# Patient Record
Sex: Male | Born: 1995 | Race: Black or African American | Hispanic: No | Marital: Single | Smoking: Current some day smoker
Health system: Southern US, Community
[De-identification: ages and names within clinical notes are randomized; demographics above are authoritative.]

## PROBLEM LIST (undated history)

## (undated) DIAGNOSIS — E739 Lactose intolerance, unspecified: Secondary | ICD-10-CM

## (undated) DIAGNOSIS — Z9889 Other specified postprocedural states: Secondary | ICD-10-CM

## (undated) DIAGNOSIS — I456 Pre-excitation syndrome: Secondary | ICD-10-CM

## (undated) HISTORY — PX: CARDIAC ELECTROPHYSIOLOGY MAPPING AND ABLATION: SHX1292

---
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
## 2015-06-29 ENCOUNTER — Emergency Department (HOSPITAL_BASED_OUTPATIENT_CLINIC_OR_DEPARTMENT_OTHER)
Admission: EM | Admit: 2015-06-29 | Discharge: 2015-06-29 | Disposition: A | Discharge status: Home / Self Care | Payer: BLUE CROSS/BLUE SHIELD | Attending: Emergency Medicine | Admitting: Emergency Medicine

## 2015-06-29 ENCOUNTER — Encounter (HOSPITAL_BASED_OUTPATIENT_CLINIC_OR_DEPARTMENT_OTHER): Payer: Self-pay | Admitting: Emergency Medicine

## 2015-06-29 DIAGNOSIS — H9201 Otalgia, right ear: Secondary | ICD-10-CM | POA: Diagnosis present

## 2015-06-29 HISTORY — DX: Lactose intolerance, unspecified: E73.9

## 2015-06-29 NOTE — ED Notes (Signed)
Pt having right ear pain for over one week after going to [employer].  Denies fever or drainage.

## 2015-06-29 NOTE — Discharge Instructions (Signed)
Your ear canal and ear tympanic membrane looks normal. There are no signs of drainage, skin sloughing, or infection.   You likely have irritation of the cartilage of your ear from earbuds at this time. Take ibuprofen and tylenol for pain. Ice as needed.  Return for worsening symptoms, including escalating pain, significant redness or swelling around the ear, hearing loss, or any other symptoms concerning to you.

## 2015-06-29 NOTE — ED Provider Notes (Signed)
CSN: [id number]     Arrival date & time 06/29/15  4098 History   First MD Initiated Contact with Patient 06/29/15 1000     Chief Complaint  Patient presents with  . Otalgia     (Consider location/radiation/quality/duration/timing/severity/associated sxs/prior Treatment) HPI 20 year old male with history of right ear pain. History of WPW with ablation. States 3 days ago while placing earbuds in his ear, he noticed a discomfort over the tragus of the right ear. Pain has been constant and gradually worsening, aggravating by moving his tragus and palpation. Denies fever, chills, swelling or redness, hearing loss. No recent cough, congestion, sore throat, or runny nose.  Past Medical History  Diagnosis Date  . WPW (Wolff-Parkinson-White syndrome)   . H/O prior ablation treatment   . Lactose intolerance    Past Surgical History  Procedure Laterality Date  . Cardiac electrophysiology mapping and ablation     History reviewed. No pertinent family history. Social History  Substance Use Topics  . Smoking status: Never Smoker   . Smokeless tobacco: Never Used  . Alcohol Use: No    Review of Systems 10/14 systems reviewed and are negative other than those stated in the HPI    Allergies  Review of patient's allergies indicates no known allergies.  Home Medications   Prior to Admission medications   Medication Sig Start Date End Date Taking? Authorizing Provider  acetaminophen (TYLENOL) 500 MG tablet Take 1 tablet (500 mg total) by mouth every 6 (six) hours as needed for pain. 11/08/11   Kaitlyn Szekalski, PA-C  doxycycline (VIBRAMYCIN) 100 MG capsule Take 1 capsule (100 mg total) by mouth 2 (two) times daily. One po bid x 7 days 07/21/13   Linwood Dibbles, MD  ibuprofen (ADVIL,MOTRIN) 800 MG tablet Take 1 tablet (800 mg total) by mouth 3 (three) times daily. 07/21/13   Linwood Dibbles, MD   BP 121/68 mmHg  Pulse 76  Temp(Src) 98.6 F (37 C) (Oral)  Resp 18  Ht  (1.753 m)  Wt 170 lb  (77.111 kg)  BMI 25.09 kg/m2  SpO2 100% Physical Exam Physical Exam  Nursing note and vitals reviewed. Constitutional: Well developed, well nourished, non-toxic, and in no acute distress Head: Normocephalic and atraumatic.  Eyes: no discharge Nose: normal Ears: Bilateral TMs normal. No drainage or skin changes of the external canals. Tenderness to palpation of the right tragus without swelling or erythema. Mouth/Throat: Oropharynx is clear and moist.  Neck: Normal range of motion.  Cardiovascular: Normal rate and regular rhythm.   Pulmonary/Chest: Effort normal.  Abdominal: Soft.  Musculoskeletal: No deformities. Neurological: Alert, no facial droop, fluent speech, moves all extremities symmetrically Skin: Skin is warm and dry.  Psychiatric: Cooperative  ED Course  Procedures (including critical care time) Labs Review Labs Reviewed - No data to display  Imaging Review No results found. I have personally reviewed and evaluated these images and lab results as part of my medical decision-making.   EKG Interpretation None      MDM   Final diagnoses:  Right ear pain     With right ear pain over the past 3 days. Pain localized to the right tragus, and tender to palpation. Normal TMs bilaterally in no acute issues with the external ear canals. Suspect potential pain from constant pressure of his earbuds, which he reports wearing a lot throughout the day. No signs of infection or any other serious process at this time. Discussed supportive care including Motrin and Tylenol, icing as  needed. He will avoid wearing his headphones during this time. Strict return and follow-up instructions reviewed. He expressed understanding of all discharge instructions and felt comfortable with the plan of care.    Lavera Guiseana Duo Liu, MD 06/29/15 1032

## 2015-07-14 ENCOUNTER — Emergency Department (HOSPITAL_BASED_OUTPATIENT_CLINIC_OR_DEPARTMENT_OTHER): Payer: BLUE CROSS/BLUE SHIELD

## 2015-07-14 ENCOUNTER — Encounter (HOSPITAL_BASED_OUTPATIENT_CLINIC_OR_DEPARTMENT_OTHER): Payer: Self-pay | Admitting: Emergency Medicine

## 2015-07-14 ENCOUNTER — Emergency Department (HOSPITAL_BASED_OUTPATIENT_CLINIC_OR_DEPARTMENT_OTHER)
Admission: EM | Admit: 2015-07-14 | Discharge: 2015-07-14 | Disposition: A | Discharge status: Home / Self Care | Payer: BLUE CROSS/BLUE SHIELD | Attending: Emergency Medicine | Admitting: Emergency Medicine

## 2015-07-14 DIAGNOSIS — N50811 Right testicular pain: Secondary | ICD-10-CM

## 2015-07-14 DIAGNOSIS — F17200 Nicotine dependence, unspecified, uncomplicated: Secondary | ICD-10-CM | POA: Diagnosis not present

## 2015-07-14 DIAGNOSIS — R52 Pain, unspecified: Secondary | ICD-10-CM

## 2015-07-14 MED ORDER — IBUPROFEN 800 MG PO TABS
800.0000 mg | ORAL_TABLET | Freq: Once | ORAL | Status: AC
  Administered 2015-07-14: 800 mg via ORAL
  Filled 2015-07-14: qty 1

## 2015-07-14 NOTE — ED Notes (Signed)
Patient reports this morning around 0100 he began having pain in the right testicle while laying in bed.  States that he then twisted it around to make it feel better.  States this helped but states that now it still feels better but doesn't feel "right".

## 2015-07-14 NOTE — Discharge Instructions (Signed)
If the pain comes back as it was when you went to bed, you need to be seen by a doctor. Do not hesitate to come back.  Call Dr. Mina MarbleMacDiarmid's office (urologist) to schedule a follow up appointment  Testicular Torsion Testicular torsion is a twisting of the spermatic cord, artery, and vein that go to the testicle. This twisting cuts off the blood supply to everything in the sac that contains the testes, blood vessels, and part of the spermatic cord (scrotum). Testicular torsion is most commonly seen in newborn and adolescent males. It can also occur before birth. Testicular torsion requires emergency treatment. The testicle usually can be saved if the torsion is treated within 6 hours of onset. If the torsion is left untreated for too long, the testicle will die and have to be removed.  CAUSES  Torsion can be caused by a hit on the scrotum or by certain movements during exercise. In some males, testicular torsion is more common because the connection of their testicle to a specific tissue in their scrotum developed in the wrong place, allowing the testicle to rotate and the cord to get twisted.  SIGNS AND SYMPTOMS  The main symptom of testicular torsion is pain in your testicle. The scrotum may be swollen, red, hard, and very tender. There will be excess fluid in the tissue (edema).The testicle may be higher than normal in the scrotum. The skin of the scrotum may be stuck to the testicle. You may have nausea, vomiting, and a fever. DIAGNOSIS  Often testicular torsion is diagnosed through a physical exam. Sometimes imaging exams and tests to measure blood flow may be done. TREATMENT  A manual untwisting of the testicle may be done when the testicle is still mobile and the maneuver is not too painful. However, surgery usually is necessary and should be done as soon as possible after torsion occurs. During surgery, the testicle is untwisted and evaluated and possibly removed.    This information is not  intended to replace advice given to you by your health care provider. Make sure you discuss any questions you have with your health care provider.   Document Released: 01/09/2005 Document Revised: 01/14/2013 Document Reviewed: 07/01/2012 Elsevier Interactive Patient Education [employer]

## 2015-07-14 NOTE — ED Notes (Signed)
Patient transported to ultrasound.

## 2015-07-14 NOTE — ED Notes (Signed)
Resident at the bedside

## 2015-07-14 NOTE — ED Provider Notes (Signed)
CSN: [id number]     Arrival date & time 07/14/15  0704 History   First MD Initiated Contact with Patient 07/14/15 [phone]     Chief Complaint  Patient presents with  . Testicle Pain   HPI Gabriel Leon is a 20 y.o. male  presenting with right testicular pain. He states pain started lasted night as he was trying to go to sleep. It did not wake him up. The pain became very severe, and he says that he "untwisted" the right testicle 1.5 turns with relief. He was able to get to sleep but still having that feeling when he woke up this morning so he decided to come in and be evaluated. He currently describes the feeling as "uncomfortable" and "weird", but says he is not sure if he is just convincing himself of this/in his head; not really painful right now. There is no pain above the testicle or in the groin. The feeling does not radiate anywhere. He says nothing makes it better or worse currently. He denies recent sexual activity or new partners, no discharge or dysuria. Denies fevers/chills, nausea or vomiting, diarrhea or constipation, abdominal pain. He denies history of undescended testicle or testicular cancer in the family.   (Consider location/radiation/quality/duration/timing/severity/associated sxs/prior Treatment) Patient is a 20 y.o. male presenting with testicular pain. The history is provided by the patient.  Testicle Pain This is a new problem. The current episode started today. The problem occurs intermittently. The problem has been rapidly improving. Pertinent negatives include no abdominal pain, anorexia, arthralgias, change in bowel habit, chills, coughing, fever, joint swelling, nausea, rash, sore throat, urinary symptoms, vomiting or weakness. Nothing aggravates the symptoms. He has tried position changes for the symptoms. The treatment provided significant relief.    Past Medical History  Diagnosis Date  . WPW (Wolff-Parkinson-White syndrome)   . H/O prior ablation treatment   .  Lactose intolerance    Past Surgical History  Procedure Laterality Date  . Cardiac electrophysiology mapping and ablation     History reviewed. No pertinent family history. Social History  Substance Use Topics  . Smoking status: Current Some Day Smoker  . Smokeless tobacco: Never Used  . Alcohol Use: Yes     Comment: occassional    Review of Systems  Constitutional: Negative for fever and chills.  HENT: Negative for sore throat and trouble swallowing.   Respiratory: Negative for cough and shortness of breath.   Gastrointestinal: Negative for nausea, vomiting, abdominal pain, anorexia and change in bowel habit.  Genitourinary: Positive for scrotal swelling and testicular pain. Negative for dysuria, urgency, frequency, flank pain, decreased urine volume, penile swelling and penile pain.  Musculoskeletal: Negative for joint swelling and arthralgias.  Skin: Negative for rash.  Neurological: Positive for dizziness (brief). Negative for weakness.  All other systems reviewed and are negative.     Allergies  Review of patient's allergies indicates no known allergies.  Home Medications   Prior to Admission medications   Medication Sig Start Date End Date Taking? Authorizing Provider  acetaminophen (TYLENOL) 500 MG tablet Take 1 tablet (500 mg total) by mouth every 6 (six) hours as needed for pain. 11/08/11   Kaitlyn Szekalski, PA-C  doxycycline (VIBRAMYCIN) 100 MG capsule Take 1 capsule (100 mg total) by mouth 2 (two) times daily. One po bid x 7 days 07/21/13   Linwood Dibbles, MD  ibuprofen (ADVIL,MOTRIN) 800 MG tablet Take 1 tablet (800 mg total) by mouth 3 (three) times daily. 07/21/13   Linwood Dibbles,  MD   BP 134/86 mmHg  Pulse 76  Temp(Src) 98.9 F (37.2 C) (Oral)  Resp 18  Ht 5\' 9"  (1.753 m)  Wt 77.111 kg  BMI 25.09 kg/m2  SpO2 100% Physical Exam  Constitutional: He is oriented to person, place, and time. He appears well-developed and well-nourished.  HENT:  Head: Normocephalic  and atraumatic.  Eyes: Conjunctivae and EOM are normal. Pupils are equal, round, and reactive to light.  Cardiovascular: Normal rate, regular rhythm, normal heart sounds and intact distal pulses.   Pulmonary/Chest: Effort normal and breath sounds normal. No respiratory distress.  Abdominal: Hernia confirmed negative in the right inguinal area and confirmed negative in the left inguinal area.  Genitourinary: Penis normal. Right testis shows no mass, no swelling and no tenderness (no tenderness of testicle or epididymis). Right testis is descended. Cremasteric reflex is not absent on the right side. Left testis shows no mass, no swelling and no tenderness. Left testis is descended. Cremasteric reflex is not absent on the left side. Circumcised. No discharge found.  Lymphadenopathy:       Right: No inguinal adenopathy present.       Left: No inguinal adenopathy present.  Neurological: He is alert and oriented to person, place, and time.  Skin: Skin is warm and dry. No rash noted.  Nursing note and vitals reviewed.   ED Course  Procedures (including critical care time) Labs Review Labs Reviewed - No data to display  Imaging Review [place] Scrotum  07/14/2015  CLINICAL DATA:  RIGHT testicular pain since 0100 hr, pain has decreased, no injury, history smoking EXAM: SCROTAL ULTRASOUND DOPPLER ULTRASOUND OF THE TESTICLES TECHNIQUE: Complete ultrasound examination of the testicles, epididymis, and other scrotal structures was performed. Color and spectral Doppler ultrasound were also utilized to evaluate blood flow to the testicles. COMPARISON:  None FINDINGS: Right testicle Measurements: 4.3 x 2.1 x 3.0 cm. Normal morphology without mass or calcification. Internal blood flow present on color Doppler imaging. Left testicle Measurements: 4.6 x 2.4 x 3.0 cm. Normal morphology without mass or calcification. Internal blood flow present on color Doppler imaging, symmetric with RIGHT Right epididymis: Heterogeneous  echogenicity containing hypoechoic nodules which could represent complex cysts. No dominant mass. Left epididymis:  Heterogeneous echogenicity without discrete mass. Hydrocele:  Small BILATERAL hydroceles RIGHT greater than LEFT Varicocele:  Absent bilaterally Pulsed Doppler interrogation of both testes demonstrates normal low resistance arterial and venous waveforms bilaterally. IMPRESSION: No acute abnormalities. Question small complex cysts within RIGHT epididymis. Electronically Signed   By: Ulyses SouthwardMark  Boles M.D.   On: 07/14/2015 09:11   [place] Art/ven Flow Abd Pelv Doppler  07/14/2015  CLINICAL DATA:  RIGHT testicular pain since 0100 hr, pain has decreased, no injury, history smoking EXAM: SCROTAL ULTRASOUND DOPPLER ULTRASOUND OF THE TESTICLES TECHNIQUE: Complete ultrasound examination of the testicles, epididymis, and other scrotal structures was performed. Color and spectral Doppler ultrasound were also utilized to evaluate blood flow to the testicles. COMPARISON:  None FINDINGS: Right testicle Measurements: 4.3 x 2.1 x 3.0 cm. Normal morphology without mass or calcification. Internal blood flow present on color Doppler imaging. Left testicle Measurements: 4.6 x 2.4 x 3.0 cm. Normal morphology without mass or calcification. Internal blood flow present on color Doppler imaging, symmetric with RIGHT Right epididymis: Heterogeneous echogenicity containing hypoechoic nodules which could represent complex cysts. No dominant mass. Left epididymis:  Heterogeneous echogenicity without discrete mass. Hydrocele:  Small BILATERAL hydroceles RIGHT greater than LEFT Varicocele:  Absent bilaterally Pulsed Doppler interrogation of both testes  demonstrates normal low resistance arterial and venous waveforms bilaterally. IMPRESSION: No acute abnormalities. Question small complex cysts within RIGHT epididymis. Electronically Signed   By: Ulyses Southward M.D.   On: 07/14/2015 09:11   I have personally reviewed and evaluated these  images and lab results as part of my medical decision-making.   EKG Interpretation None      MDM   Final diagnoses:  Pain  Testicular pain, right    History does sound consistent with torsion that he was able to self manual detorse. US scrotum with duplex shows normal arterial flow currently. Discussed possibility of torsion, to return if pain comes back, avoid heavy lifting and sex for next week, follow up with urology as outpatient if otherwise not having issue or only mild discomfort pain.     Nani Ravens, MD 07/14/15 1036  Zadie Rhine, MD 07/14/15 1054

## 2015-07-14 NOTE — ED Notes (Signed)
Patient returned from ultrasound.

## 2015-07-14 NOTE — ED Provider Notes (Signed)
Patient seen/examined in the Emergency Department [facility] Patient reports right testicle pain while in bed when he crossed his legs.  He immediately twisted the testicle around and had immediate pain relief Exam : awake/alert, testicles descended bilaterally, no scrotal edema Plan: will obtain scrotal ultrasound.  If negative will need close urology followup    Zadie Rhineonald Duriel Deery, MD 07/14/15 [phone]

## 2016-10-27 IMAGING — US US SCROTUM
1 series · 13 of 25 positions shown · non-contrast
Comparison: None

CLINICAL DATA: RIGHT testicular pain since 3433 hr, pain has
decreased, no injury, history smoking

EXAM:
SCROTAL ULTRASOUND
DOPPLER ULTRASOUND OF THE TESTICLES
TECHNIQUE: Complete ultrasound examination of the testicles, epididymis, and
other scrotal structures was performed. Color and spectral Doppler
ultrasound were also utilized to evaluate blood flow to the
testicles.

[Series 1: us scrotum · 0.07mm/px · 13 of 59 slices shown]
[im 1/59]
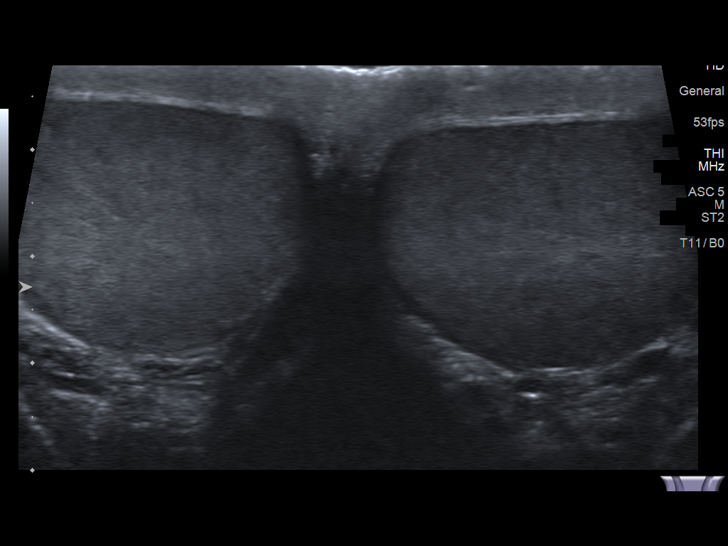
[im 5/59]
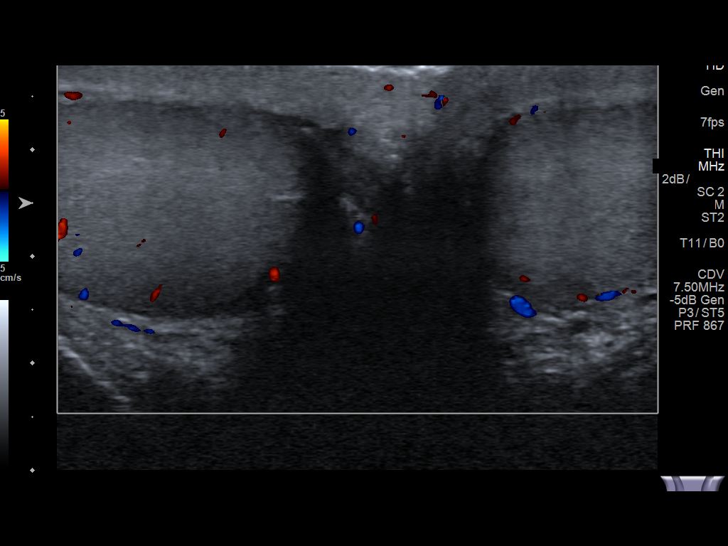
[im 10/59]
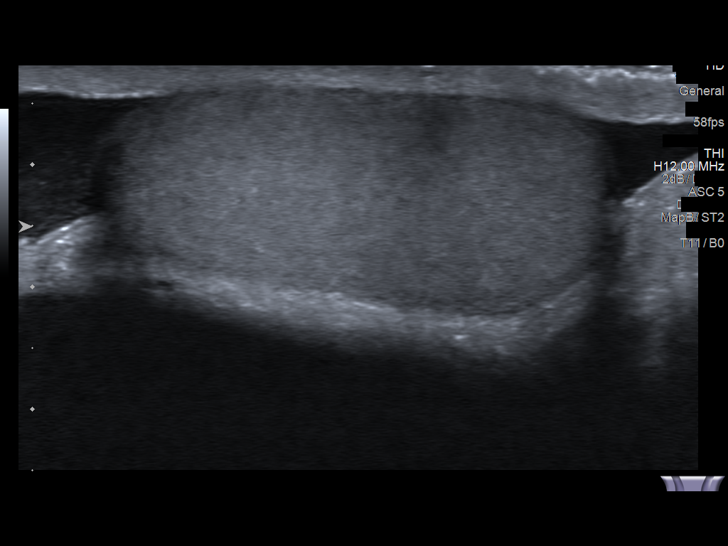
[im 15/59]
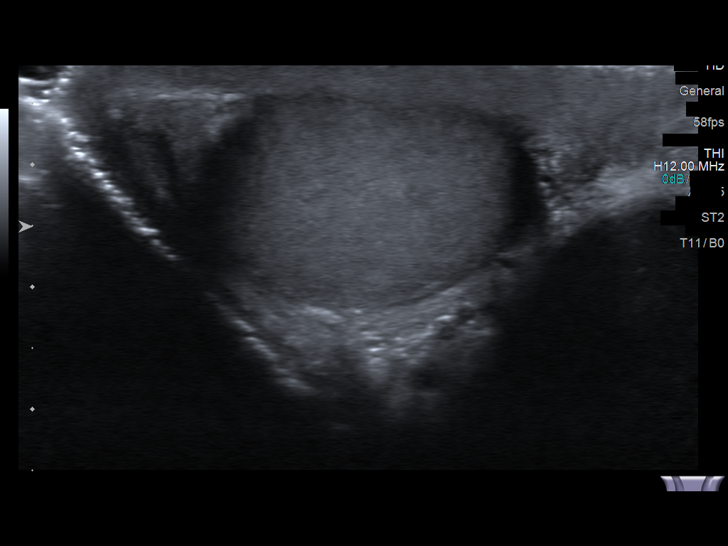
[im 20/59]
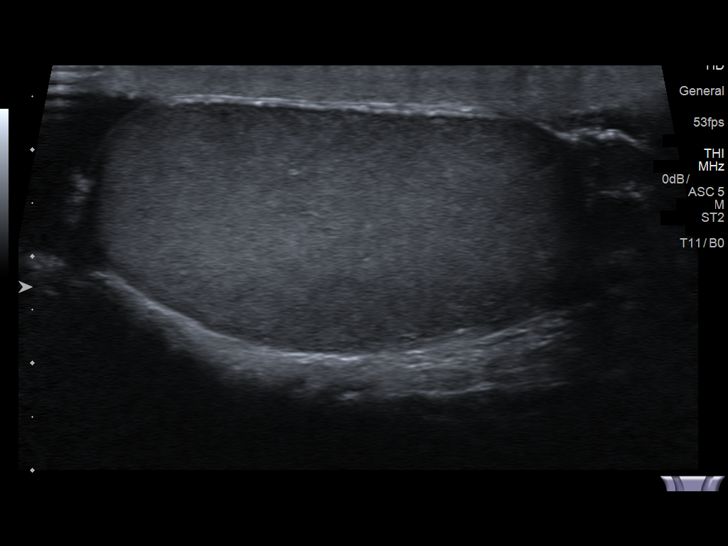
[im 25/59]
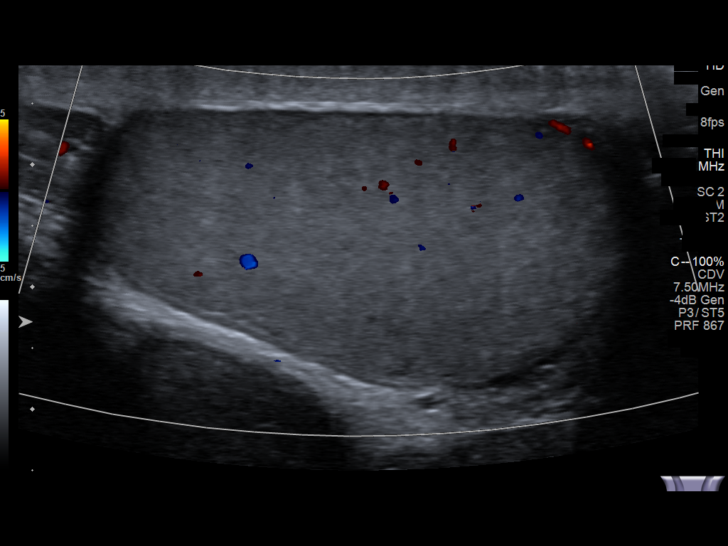
[im 30/59]
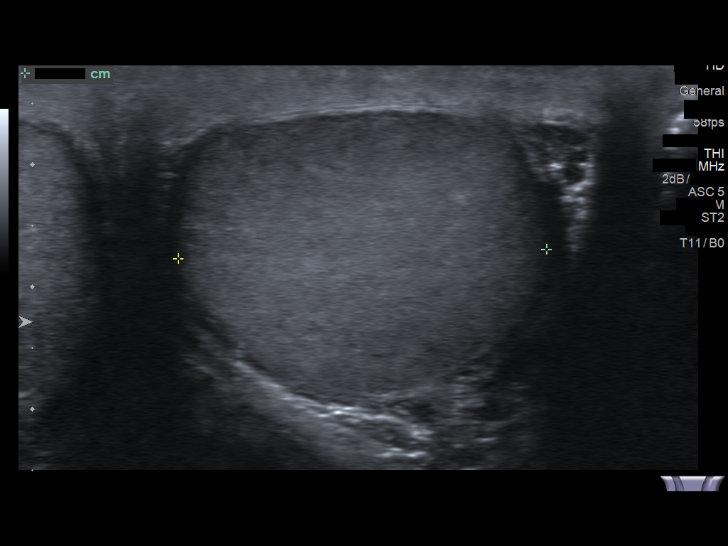
[im 34/59]
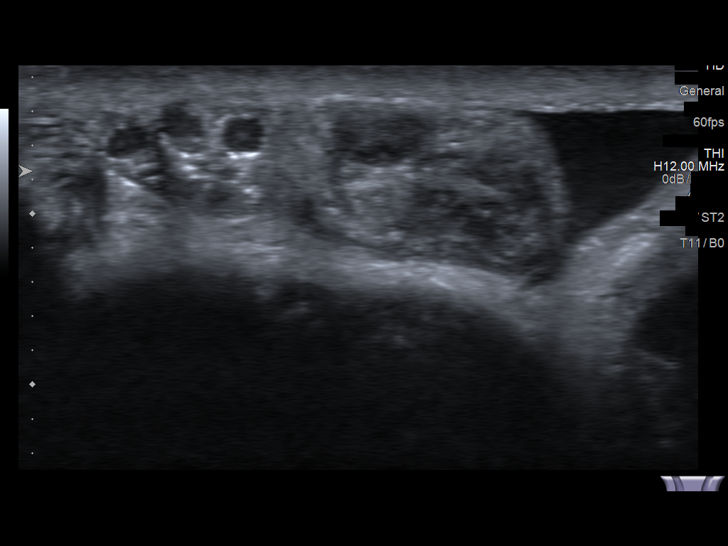
[im 39/59]
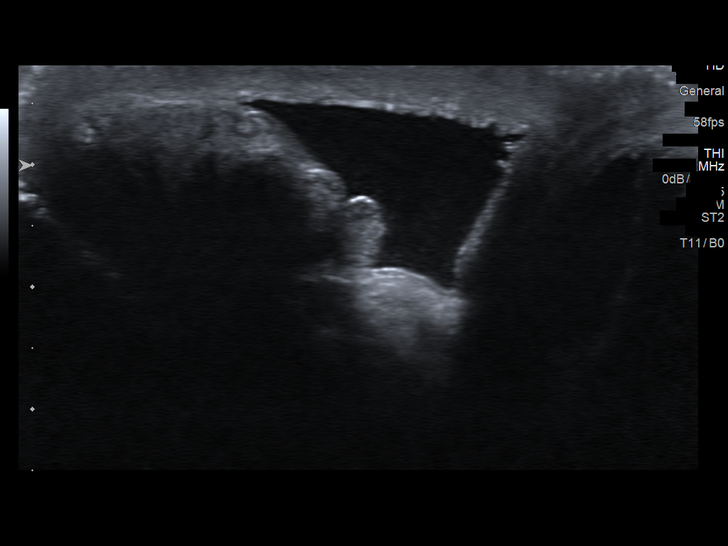
[im 44/59]
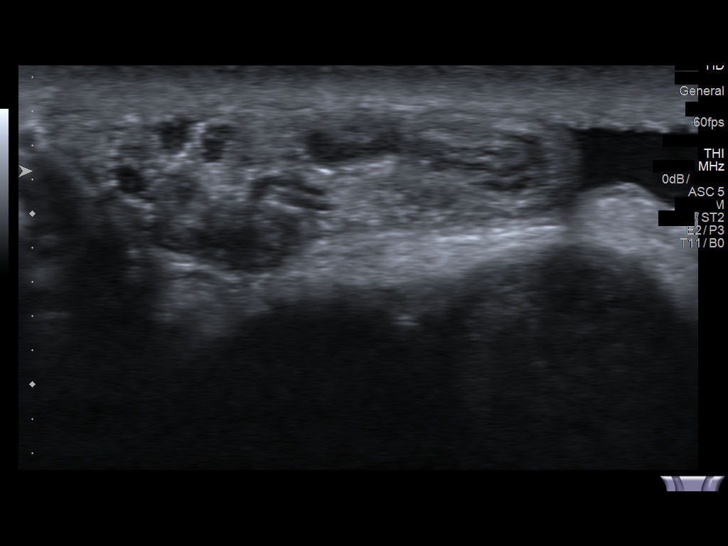
[im 49/59]
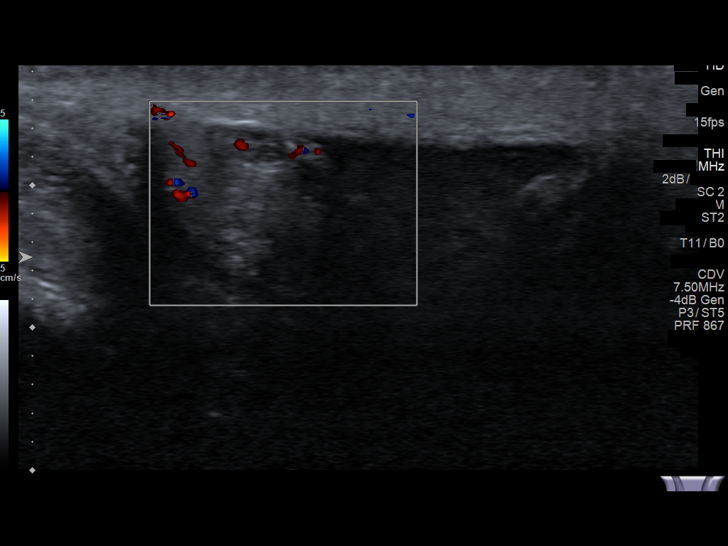
[im 54/59]
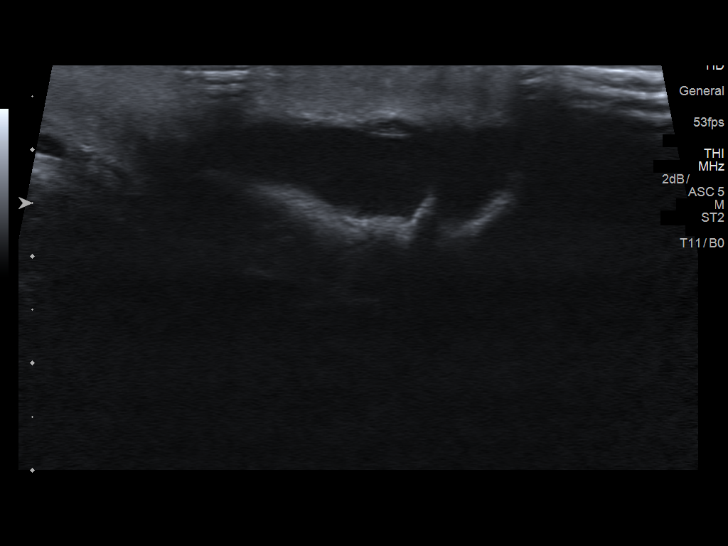
[im 59/59]
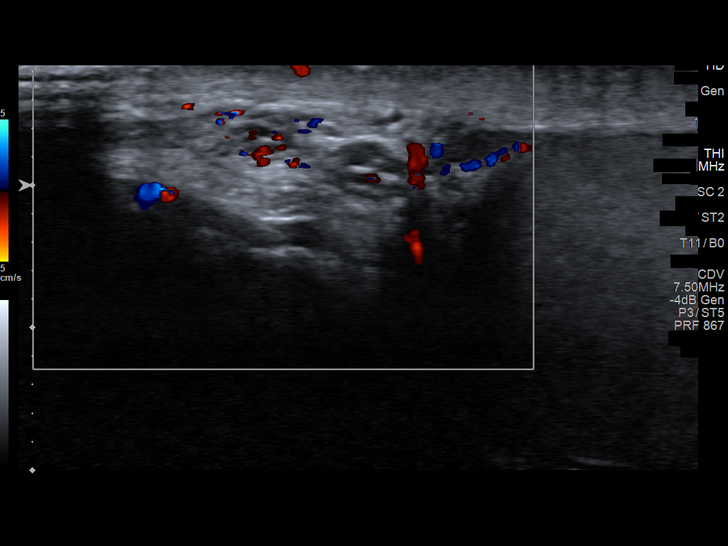

[13 of 25 positions shown; findings below may reference images not displayed]

FINDINGS: Right testicle

Measurements: 4.3 x 2.1 x 3.0 cm. Normal morphology without mass or
calcification. Internal blood flow present on color Doppler imaging.

Left testicle

Measurements: 4.6 x 2.4 x 3.0 cm. Normal morphology without mass or
calcification. Internal blood flow present on color Doppler imaging,
symmetric with RIGHT

Right epididymis: Heterogeneous echogenicity containing hypoechoic
nodules which could represent complex cysts. No dominant mass.

Left epididymis:  Heterogeneous echogenicity without discrete mass.

Hydrocele:  Small BILATERAL hydroceles RIGHT greater than LEFT

Varicocele:  Absent bilaterally

Pulsed Doppler interrogation of both testes demonstrates normal low
resistance arterial and venous waveforms bilaterally.
IMPRESSION: No acute abnormalities.

Question small complex cysts within RIGHT epididymis.

## 2017-01-26 ENCOUNTER — Other Ambulatory Visit: Payer: Self-pay

## 2017-01-26 ENCOUNTER — Encounter (HOSPITAL_BASED_OUTPATIENT_CLINIC_OR_DEPARTMENT_OTHER): Payer: Self-pay | Admitting: Adult Health

## 2017-01-26 DIAGNOSIS — F17200 Nicotine dependence, unspecified, uncomplicated: Secondary | ICD-10-CM | POA: Diagnosis not present

## 2017-01-26 DIAGNOSIS — R110 Nausea: Secondary | ICD-10-CM | POA: Insufficient documentation

## 2017-01-26 DIAGNOSIS — R072 Precordial pain: Secondary | ICD-10-CM | POA: Diagnosis present

## 2017-01-26 NOTE — ED Triage Notes (Addendum)
Presents with chest pressure in the center of chest that began yesterday after eating a large burger. Pt states it felt like it got lodged in my esophagus. And I was unable to eat until this evening and it felt like the same thing. I feel like I have to breath deeper to take a deep breath" He denies pain and states it only hurts when I am eating.  Hx of WPW

## 2017-01-27 ENCOUNTER — Emergency Department (HOSPITAL_BASED_OUTPATIENT_CLINIC_OR_DEPARTMENT_OTHER)
Admission: EM | Admit: 2017-01-27 | Discharge: 2017-01-27 | Disposition: A | Discharge status: Home / Self Care | Payer: BLUE CROSS/BLUE SHIELD | Attending: Emergency Medicine | Admitting: Emergency Medicine

## 2017-01-27 DIAGNOSIS — R072 Precordial pain: Secondary | ICD-10-CM

## 2017-01-27 MED ORDER — GI COCKTAIL ~~LOC~~
30.0000 mL | Freq: Once | ORAL | Status: AC
  Administered 2017-01-27: 30 mL via ORAL
  Filled 2017-01-27: qty 30

## 2017-01-27 MED ORDER — OMEPRAZOLE 20 MG PO CPDR: 20.0000 mg | DELAYED_RELEASE_CAPSULE | Freq: Every day | ORAL | 1 refills | Status: AC

## 2017-01-27 NOTE — ED Notes (Signed)
Alert, NAD, calm, interactive, resps e/u, speaking in clear complete sentences, no dyspnea noted, skin W&D, VSS, c/o epigastric pressure, intermittent, worse when eating, worse when lying down, better than previously at this time, (denies: sob, cough, NVD, fever, bleeding, dizziness or visual changes).[facility]

## 2017-01-27 NOTE — ED Notes (Signed)
Steady gait from w/r to exam room. Alert, NAD, calm, interactive, resps e/u, no dyspnea noted, steady gait, initial VSS, EKG reviewed. [facility]

## 2017-01-27 NOTE — ED Provider Notes (Signed)
[facility] EMERGENCY DEPARTMENT Provider Note   CSN: [id number] Arrival date & time: 01/26/17  2109     History   Chief Complaint Chief Complaint  Patient presents with  . Chest Pain    HPI Gabriel Leon is a 22 y.o. male.  The history is provided by the patient and a parent.  Chest Pain   This is a new problem. The current episode started yesterday. The problem occurs constantly. The problem has been gradually worsening. The pain is associated with eating. The pain is present in the substernal region. The pain is moderate. The quality of the pain is described as pressure-like. Associated symptoms include nausea. Pertinent negatives include no cough, no fever, no shortness of breath and no vomiting. He has tried nothing for the symptoms.  Patient reports that yesterday after eating a large hamburger he began having chest pain.  He says since that time he has had pain with swallowing. Earlier in the night for dinner he had ribs and had to quit eating because it hurt every time he swallowed Feels like something is getting stuck in his chest He denies shortness of breath, but reports he "have to breathe deeper to take a deep breath. No fevers or vomiting He is otherwise been well  Past Medical History:  Diagnosis Date  . H/O prior ablation treatment   . Lactose intolerance   . WPW (Wolff-Parkinson-White syndrome)     Patient Active Problem List   Diagnosis Date Noted  . Folliculitis 04/26/2013    Past Surgical History:  Procedure Laterality Date  . CARDIAC ELECTROPHYSIOLOGY MAPPING AND ABLATION         Home Medications    Prior to Admission medications   Medication Sig Start Date End Date Taking? Authorizing Provider  acetaminophen (TYLENOL) 500 MG tablet Take 1 tablet (500 mg total) by mouth every 6 (six) hours as needed for pain. 11/08/11   Emilia Beck, PA-C  doxycycline (VIBRAMYCIN) 100 MG capsule Take 1 capsule (100 mg total) by mouth 2 (two)  times daily. One po bid x 7 days 07/21/13   Linwood Dibbles, MD  ibuprofen (ADVIL,MOTRIN) 800 MG tablet Take 1 tablet (800 mg total) by mouth 3 (three) times daily. 07/21/13   Linwood Dibbles, MD    Family History History reviewed. No pertinent family history.  Social History Social History   Tobacco Use  . Smoking status: Current Some Day Smoker  . Smokeless tobacco: Never Used  Substance Use Topics  . Alcohol use: Yes    Comment: occassional  . Drug use: Yes    Types: Marijuana    Comment: 1-2 times monthly     Allergies   Patient has no known allergies.   Review of Systems Review of Systems  Constitutional: Negative for fever.  Respiratory: Negative for cough and shortness of breath.   Cardiovascular: Positive for chest pain.  Gastrointestinal: Positive for nausea. Negative for vomiting.  All other systems reviewed and are negative.    Physical Exam Updated Vital Signs BP 132/79 (BP Location: Right Arm)   Pulse 84   Temp 98.9 F (37.2 C) (Oral)   Resp 18   SpO2 100%   Physical Exam CONSTITUTIONAL: Well developed/well nourished HEAD: Normocephalic/atraumatic EYES: EOMI/PERRL ENMT: Mucous membranes moist, uvula midline, no erythema or exudates NECK: supple no meningeal signs, no crepitus SPINE/BACK:entire spine nontender CV: S1/S2 noted, no murmurs/rubs/gallops noted LUNGS: Lungs are clear to auscultation bilaterally, no apparent distress ABDOMEN: soft, nontender, no rebound or guarding,  bowel sounds noted throughout abdomen GU:no cva tenderness NEURO: Pt is awake/alert/appropriate, moves all extremitiesx4.  No facial droop.   EXTREMITIES: pulses normal/equal, full ROM SKIN: warm, color normal PSYCH: no abnormalities of mood noted, alert and oriented to situation   ED Treatments / Results  Labs (all labs ordered are listed, but only abnormal results are displayed) Labs Reviewed - No data to display  EKG  EKG Interpretation  Date/Time:  Friday January 26 2017  21:27:38 EST Ventricular Rate:  69 PR Interval:  164 QRS Duration: 90 QT Interval:  360 QTC Calculation: 385 R Axis:   80 Text Interpretation:  Normal sinus rhythm ST elevation, consider early repolarization No significant change since last tracing Confirmed by Drema Pryardama, Pedro [phone]([id number]) on 01/26/2017 9:33:12 PM       Radiology No results found.  Procedures Procedures (including critical care time)  Medications Ordered in ED Medications  gi cocktail (Maalox,Lidocaine,Donnatal) (30 mLs Oral Given 01/27/17 0050)     Initial Impression / Assessment and Plan / ED Course  I have reviewed the triage vital signs and the nursing notes.      Appears related to eating.  He reports every time he eats or swallows the pain begins Advised he will need to make dietary changes including avoiding steak burgers, hot dogs, ribs Given referral to a GI specialist Also started on a PPI I have low suspicion for any other cardiac or pulmonary cause for his pain  Final Clinical Impressions(s) / ED Diagnoses   Final diagnoses:  Precordial pain    ED Discharge Orders    None       Zadie RhineWickline, Erbie Arment, MD 01/27/17 0115

## 2017-01-27 NOTE — ED Notes (Signed)
EDP into room, prior to RN assessment, see MD notes, pending orders.   

## 2017-01-27 NOTE — ED Notes (Signed)
EDP in to room at time of d/c 

## 2017-06-07 ENCOUNTER — Other Ambulatory Visit: Payer: Self-pay

## 2017-06-07 ENCOUNTER — Encounter (HOSPITAL_BASED_OUTPATIENT_CLINIC_OR_DEPARTMENT_OTHER): Payer: Self-pay | Admitting: *Deleted

## 2017-06-07 ENCOUNTER — Emergency Department (HOSPITAL_BASED_OUTPATIENT_CLINIC_OR_DEPARTMENT_OTHER)
Admission: EM | Admit: 2017-06-07 | Discharge: 2017-06-07 | Disposition: A | Discharge status: Home / Self Care | Payer: BLUE CROSS/BLUE SHIELD | Attending: Emergency Medicine | Admitting: Emergency Medicine

## 2017-06-07 DIAGNOSIS — Z79899 Other long term (current) drug therapy: Secondary | ICD-10-CM | POA: Insufficient documentation

## 2017-06-07 DIAGNOSIS — H60501 Unspecified acute noninfective otitis externa, right ear: Secondary | ICD-10-CM | POA: Diagnosis not present

## 2017-06-07 DIAGNOSIS — F17200 Nicotine dependence, unspecified, uncomplicated: Secondary | ICD-10-CM | POA: Diagnosis not present

## 2017-06-07 DIAGNOSIS — H9201 Otalgia, right ear: Secondary | ICD-10-CM | POA: Diagnosis present

## 2017-06-07 MED ORDER — NEOMYCIN-POLYMYXIN-HC 3.5-10000-1 OT SUSP: 4.0000 [drp] | Freq: Three times a day (TID) | OTIC | 0 refills | Status: AC

## 2017-06-07 NOTE — ED Provider Notes (Signed)
[facility] EMERGENCY DEPARTMENT Provider Note   CSN: [id number] Arrival date & time: 06/07/17  1258     History   Chief Complaint Chief Complaint  Patient presents with  . Otalgia    HPI Gabriel Leon is a 22 y.o. male.  HPI   Gabriel Leon is a 22 year old male with no significant past medical history who presents to the emergency department for evaluation of right ear pain.  The patient reports that he scratched his ear several days ago and has noticed swelling in the canal and pain ever since.  States that he tried using a Q-tip to clean the ear, although it was very painful and he noticed some brown drainage.  He states the pain is 5/10 in severity, although worsened with tugging over the ear.  He denies any recent swimming.  Has taken over-the-counter pain reliever without significant improvement.  He denies fevers, chills, tinnitus, hearing loss, congestion, sore throat, cough.  Past Medical History:  Diagnosis Date  . H/O prior ablation treatment   . Lactose intolerance   . WPW (Wolff-Parkinson-White syndrome)     Patient Active Problem List   Diagnosis Date Noted  . Folliculitis 04/26/2013    Past Surgical History:  Procedure Laterality Date  . CARDIAC ELECTROPHYSIOLOGY MAPPING AND ABLATION          Home Medications    Prior to Admission medications   Medication Sig Start Date End Date Taking? Authorizing Provider  acetaminophen (TYLENOL) 500 MG tablet Take 1 tablet (500 mg total) by mouth every 6 (six) hours as needed for pain. 11/08/11   Emilia Beck, PA-C  doxycycline (VIBRAMYCIN) 100 MG capsule Take 1 capsule (100 mg total) by mouth 2 (two) times daily. One po bid x 7 days 07/21/13   Linwood Dibbles, MD  ibuprofen (ADVIL,MOTRIN) 800 MG tablet Take 1 tablet (800 mg total) by mouth 3 (three) times daily. 07/21/13   Linwood Dibbles, MD  neomycin-polymyxin-hydrocortisone (CORTISPORIN) 3.5-10000-1 OTIC suspension Place 4 drops into the right ear 3 (three)  times daily for 10 days. 06/07/17 06/17/17  Kellie Shropshire, PA-C  omeprazole (PRILOSEC) 20 MG capsule Take 1 capsule (20 mg total) by mouth daily. 01/27/17   Zadie Rhine, MD    Family History History reviewed. No pertinent family history.  Social History Social History   Tobacco Use  . Smoking status: Current Some Day Smoker  . Smokeless tobacco: Never Used  Substance Use Topics  . Alcohol use: Yes    Comment: occassional  . Drug use: Yes    Types: Marijuana    Comment: 1-2 times monthly     Allergies   Patient has no known allergies.   Review of Systems Review of Systems  Constitutional: Negative for chills and fever.  HENT: Positive for ear discharge and ear pain. Negative for congestion, hearing loss and sore throat.   Respiratory: Negative for cough.      Physical Exam Updated Vital Signs BP 115/74 (BP Location: Left Arm)   Pulse 60   Temp 98.3 F (36.8 C) (Oral)   Resp 16   Ht  (1.753 m)   Wt 77.1 kg (170 lb)   SpO2 100%   BMI 25.10 kg/m   Physical Exam  Constitutional: He appears well-developed and well-nourished. No distress.  HENT:  Head: Normocephalic and atraumatic.  Mouth/Throat: Oropharynx is clear and moist. No oropharyngeal exudate.  Right tragus tender to palpation.  Right ear canal erythematous, swollen.  Right TM visible, good  cone of light.  No tenderness over the right mastoid.  Left ear canal normal, TM with good cone of light and no mastoid tenderness.   Eyes: Pupils are equal, round, and reactive to light. Conjunctivae are normal. Right eye exhibits no discharge. Left eye exhibits no discharge.  Neck: Normal range of motion. Neck supple.  Pulmonary/Chest: Effort normal. No respiratory distress.  Neurological: He is alert. Coordination normal.  Skin: He is not diaphoretic.  Psychiatric: He has a normal mood and affect. His behavior is normal.  Nursing note and vitals reviewed.    ED Treatments / Results  Labs (all labs  ordered are listed, but only abnormal results are displayed) Labs Reviewed - No data to display  EKG None  Radiology No results found.  Procedures Procedures (including critical care time)  Medications Ordered in ED Medications - No data to display   Initial Impression / Assessment and Plan / ED Course  I have reviewed the triage vital signs and the nursing notes.  Pertinent labs & imaging results that were available during my care of the patient were reviewed by me and considered in my medical decision making (see chart for details).     Pt presenting with otitis externa. No canal occlusion, Pt afebrile in NAD. Exam non concerning for mastoiditis, cellulitis or malignant OE. Dc with cortisporin script. He is going on vacation tomorrow, counseled him to avoid swimming with head under water. Counseled him on return precautions/reasons to seek further medical care and he agrees and voices understanding.    Final Clinical Impressions(s) / ED Diagnoses   Final diagnoses:  Acute otitis externa of right ear, unspecified type    ED Discharge Orders        Ordered    neomycin-polymyxin-hydrocortisone (CORTISPORIN) 3.5-10000-1 OTIC suspension  3 times daily     06/07/17 1519       Kellie Shropshire, PA-C 06/07/17 1525    Tegeler, Canary Brim, MD 06/07/17 [phone]

## 2017-06-07 NOTE — Discharge Instructions (Addendum)
Please take antibiotic drops for your ear infection.  Avoid swimming with your head underwater while you are being treated.  Return to the emergency department if you have any new or concerning symptoms like fever greater than 100.4 F, loss of hearing.

## 2017-06-07 NOTE — ED Triage Notes (Signed)
Pt reports scratching his right ear a few days ago, today noticed that his right ear is painful and tender and "feels swollen inside." denies any drainage.
